# Patient Record
Sex: Male | Born: 1985 | Race: White | Hispanic: No | Marital: Married | Smoking: Never smoker
Health system: Southern US, Community
[De-identification: ages and names within clinical notes are randomized; demographics above are authoritative.]

## PROBLEM LIST (undated history)

## (undated) DIAGNOSIS — I10 Essential (primary) hypertension: Secondary | ICD-10-CM

## (undated) HISTORY — PX: FRACTURE SURGERY: SHX138

---

## 2012-10-09 ENCOUNTER — Emergency Department (HOSPITAL_BASED_OUTPATIENT_CLINIC_OR_DEPARTMENT_OTHER)
Admission: EM | Admit: 2012-10-09 | Discharge: 2012-10-09 | Disposition: A | Discharge status: Home / Self Care | Payer: Self-pay | Attending: Emergency Medicine | Admitting: Emergency Medicine

## 2012-10-09 ENCOUNTER — Encounter (HOSPITAL_BASED_OUTPATIENT_CLINIC_OR_DEPARTMENT_OTHER): Payer: Self-pay | Admitting: *Deleted

## 2012-10-09 ENCOUNTER — Emergency Department (HOSPITAL_BASED_OUTPATIENT_CLINIC_OR_DEPARTMENT_OTHER): Payer: Self-pay

## 2012-10-09 DIAGNOSIS — R110 Nausea: Secondary | ICD-10-CM | POA: Insufficient documentation

## 2012-10-09 DIAGNOSIS — R509 Fever, unspecified: Secondary | ICD-10-CM

## 2012-10-09 DIAGNOSIS — J029 Acute pharyngitis, unspecified: Secondary | ICD-10-CM | POA: Insufficient documentation

## 2012-10-09 DIAGNOSIS — J101 Influenza due to other identified influenza virus with other respiratory manifestations: Secondary | ICD-10-CM | POA: Insufficient documentation

## 2012-10-09 DIAGNOSIS — R42 Dizziness and giddiness: Secondary | ICD-10-CM | POA: Insufficient documentation

## 2012-10-09 MED ORDER — ACETAMINOPHEN 325 MG PO TABS
650.0000 mg | ORAL_TABLET | Freq: Once | ORAL | Status: AC
  Administered 2012-10-09: 650 mg via ORAL
  Filled 2012-10-09: qty 2

## 2012-10-09 MED ORDER — IBUPROFEN 400 MG PO TABS
600.0000 mg | ORAL_TABLET | Freq: Once | ORAL | Status: AC
  Administered 2012-10-09: 600 mg via ORAL
  Filled 2012-10-09: qty 1

## 2012-10-09 MED ORDER — OSELTAMIVIR PHOSPHATE 75 MG PO CAPS: 75.0000 mg | ORAL_CAPSULE | Freq: Two times a day (BID) | ORAL | Status: AC

## 2012-10-09 MED ORDER — OSELTAMIVIR PHOSPHATE 75 MG PO CAPS
75.0000 mg | ORAL_CAPSULE | Freq: Once | ORAL | Status: AC
  Administered 2012-10-09: 75 mg via ORAL
  Filled 2012-10-09: qty 1

## 2012-10-09 NOTE — ED Provider Notes (Signed)
History  This chart was scribed for Lyanne Co, MD by Ardeen Jourdain, ED Scribe. This patient was seen in room MH05/MH05 and the patient's care was started at 1837.  CSN: [id number]  Arrival date & time 10/09/12  1644   First MD Initiated Contact with Patient 10/09/12 1837      Chief Complaint  Patient presents with  . Fever     The history is provided by the patient. No language interpreter was used.    Thomas Santos is a 26 y.o. male who presents to the Emergency Department complaining of fever with associated congestion, sore throat, nausea and light-headedness that started 4 days ago. He denies appetite change, emesis, diarrhea, painful urination, CP and SOB as associated symptoms. He states the symptoms have been intermittent during this time. He reports taking Tylenol/motrin for the fever with only slight relief. He states the medications will bring the fever down around 100.0 degrees but the fever returns. He denies sick contact.  Patient's wife is high-risk pregnancy currently   History reviewed. No pertinent past medical history.  Past Surgical History  Procedure Date  . Fracture surgery     History reviewed. No pertinent family history.  History  Substance Use Topics  . Smoking status: Never Smoker   . Smokeless tobacco: Not on file  . Alcohol Use: No      Review of Systems  All other systems reviewed and are negative.   A complete 10 system review of systems was obtained and all systems are negative except as noted in the HPI and PMH.    Allergies  Review of patient's allergies indicates no known allergies.  Home Medications  No current outpatient prescriptions on file.  Triage Vitals: BP 164/93  Pulse 110  Temp 103.3 F (39.6 C) (Oral)  Resp 18  Ht 5\' 11"  (1.803 m)  Wt 175 lb (79.379 kg)  BMI 24.41 kg/m2  SpO2 100%  Physical Exam  Nursing note and vitals reviewed. Constitutional: He is oriented to person, place, and time. He appears  well-developed and well-nourished.  HENT:  Head: Normocephalic and atraumatic.  Eyes: EOM are normal.  Neck: Normal range of motion.  Cardiovascular: Normal rate, regular rhythm, normal heart sounds and intact distal pulses.   Pulmonary/Chest: Effort normal and breath sounds normal. No respiratory distress. He has no wheezes.  Abdominal: Soft. He exhibits no distension. There is no tenderness.  Musculoskeletal: Normal range of motion.  Neurological: He is alert and oriented to person, place, and time.  Skin: Skin is warm and dry.  Psychiatric: He has a normal mood and affect. Judgment normal.    ED Course  Procedures (including critical care time)  DIAGNOSTIC STUDIES: Oxygen Saturation is 100% on room air, normal by my interpretation.    COORDINATION OF CARE:  6:55 PM: Discussed treatment plan which includes CXR, fluids and Tamiflu with pt at bedside and pt agreed to plan.    Labs Reviewed  INFLUENZA PANEL BY PCR   Dg Chest 2 View  10/09/2012  *RADIOLOGY REPORT*  Clinical Data: Fever, cough and rhinorrhea.  CHEST - 2 VIEW  Comparison: None.  Findings: Trachea is midline.  Heart size normal.  Lungs are clear. No pleural fluid.  IMPRESSION: No acute findings.   Original Report Authenticated By: Leanna Battles, M.D.    I personally reviewed the imaging tests through PACS system I reviewed available ER/hospitalization records through the EMR   1. Fever   2. Influenza-like illness  MDM  Influenza like illness.  Chest x-ray is clear.  The patient is feeling better after antipyretics.  Home with Tamiflu given his high-risk pregnancy wife.      I personally performed the services described in this documentation, which was scribed in my presence. The recorded information has been reviewed and is accurate.      Lyanne Co, MD 10/09/12 [phone]

## 2012-10-09 NOTE — ED Notes (Signed)
Fever, congestion x 4 days off and on. Taking Motrin/Tylenol but only brings down to 100.0.

## 2012-10-10 LAB — INFLUENZA PANEL BY PCR (TYPE A & B): H1N1 flu by pcr: DETECTED — AB

## 2015-07-05 ENCOUNTER — Emergency Department (HOSPITAL_BASED_OUTPATIENT_CLINIC_OR_DEPARTMENT_OTHER): Payer: No Typology Code available for payment source

## 2015-07-05 ENCOUNTER — Emergency Department (HOSPITAL_BASED_OUTPATIENT_CLINIC_OR_DEPARTMENT_OTHER)
Admission: EM | Admit: 2015-07-05 | Discharge: 2015-07-05 | Disposition: A | Discharge status: Home / Self Care | Payer: No Typology Code available for payment source | Attending: Emergency Medicine | Admitting: Emergency Medicine

## 2015-07-05 ENCOUNTER — Encounter (HOSPITAL_BASED_OUTPATIENT_CLINIC_OR_DEPARTMENT_OTHER): Payer: Self-pay | Admitting: *Deleted

## 2015-07-05 DIAGNOSIS — Y9389 Activity, other specified: Secondary | ICD-10-CM | POA: Insufficient documentation

## 2015-07-05 DIAGNOSIS — I10 Essential (primary) hypertension: Secondary | ICD-10-CM | POA: Diagnosis not present

## 2015-07-05 DIAGNOSIS — Y998 Other external cause status: Secondary | ICD-10-CM | POA: Insufficient documentation

## 2015-07-05 DIAGNOSIS — S8992XA Unspecified injury of left lower leg, initial encounter: Secondary | ICD-10-CM | POA: Diagnosis present

## 2015-07-05 DIAGNOSIS — Y92410 Unspecified street and highway as the place of occurrence of the external cause: Secondary | ICD-10-CM | POA: Insufficient documentation

## 2015-07-05 DIAGNOSIS — S8002XA Contusion of left knee, initial encounter: Secondary | ICD-10-CM | POA: Diagnosis not present

## 2015-07-05 HISTORY — DX: Essential (primary) hypertension: I10

## 2015-07-05 MED ORDER — IBUPROFEN 400 MG PO TABS
400.0000 mg | ORAL_TABLET | Freq: Once | ORAL | Status: AC
  Administered 2015-07-05: 400 mg via ORAL
  Filled 2015-07-05: qty 1
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
